# Patient Record
Sex: Female | Born: 1995 | Race: White | Hispanic: No | Marital: Married | Smoking: Never smoker
Health system: Southern US, Community
[De-identification: ages and names within clinical notes are randomized; demographics above are authoritative.]

---
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
## 2017-06-13 DIAGNOSIS — Z0100 Encounter for examination of eyes and vision without abnormal findings: Secondary | ICD-10-CM | POA: Diagnosis not present

## 2019-06-02 ENCOUNTER — Other Ambulatory Visit: Payer: Self-pay | Admitting: Internal Medicine

## 2019-06-02 DIAGNOSIS — E010 Iodine-deficiency related diffuse (endemic) goiter: Secondary | ICD-10-CM

## 2019-06-12 ENCOUNTER — Ambulatory Visit
Admission: RE | Admit: 2019-06-12 | Discharge: 2019-06-12 | Disposition: A | Discharge status: Home / Self Care | Payer: 59 | Source: Ambulatory Visit | Attending: Internal Medicine | Admitting: Internal Medicine

## 2019-06-12 DIAGNOSIS — E010 Iodine-deficiency related diffuse (endemic) goiter: Secondary | ICD-10-CM

## 2019-06-23 ENCOUNTER — Other Ambulatory Visit: Payer: Self-pay | Admitting: Obstetrics and Gynecology

## 2019-06-23 DIAGNOSIS — N6320 Unspecified lump in the left breast, unspecified quadrant: Secondary | ICD-10-CM

## 2019-07-06 ENCOUNTER — Other Ambulatory Visit: Payer: Self-pay

## 2019-07-06 ENCOUNTER — Ambulatory Visit
Admission: RE | Admit: 2019-07-06 | Discharge: 2019-07-06 | Disposition: A | Discharge status: Home / Self Care | Payer: 59 | Source: Ambulatory Visit | Attending: Obstetrics and Gynecology | Admitting: Obstetrics and Gynecology

## 2019-07-06 DIAGNOSIS — N6320 Unspecified lump in the left breast, unspecified quadrant: Secondary | ICD-10-CM

## 2020-06-15 IMAGING — US US THYROID
1 series · 13 of 25 positions shown · non-contrast
Comparison: None.

CLINICAL DATA: Palpable abnormality. 23-year-old female with
thyromegaly

EXAM:
THYROID ULTRASOUND
TECHNIQUE: Ultrasound examination of the thyroid gland and adjacent soft
tissues was performed.

[Series 1: us thyroid · 0.04mm/px · 13 of 38 slices shown]
[im 1/38]
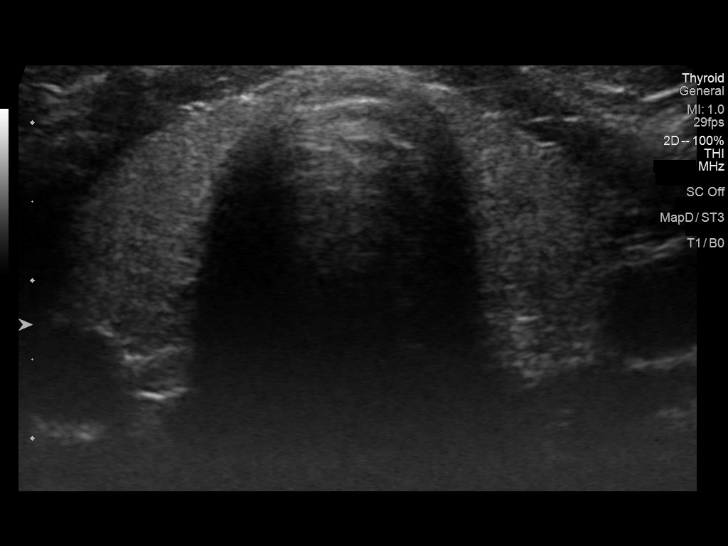
[im 4/38]
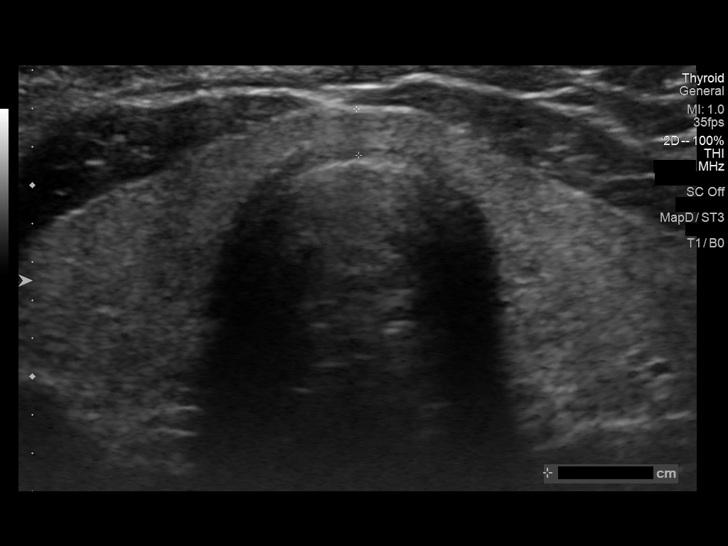
[im 7/38]
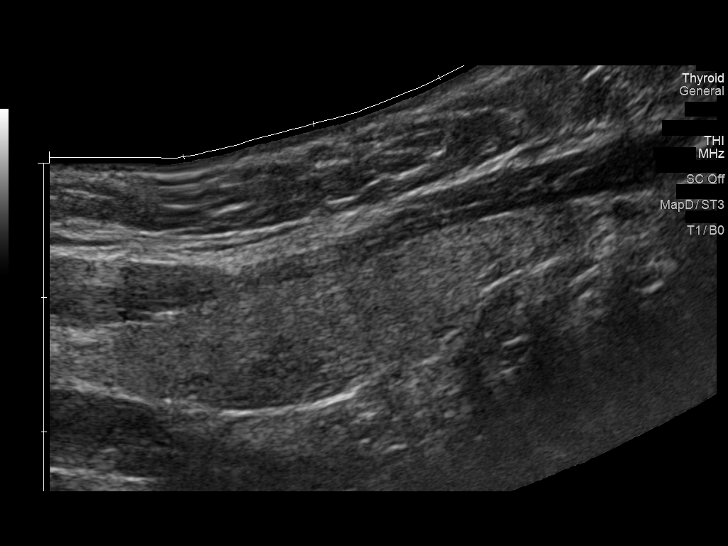
[im 10/38]
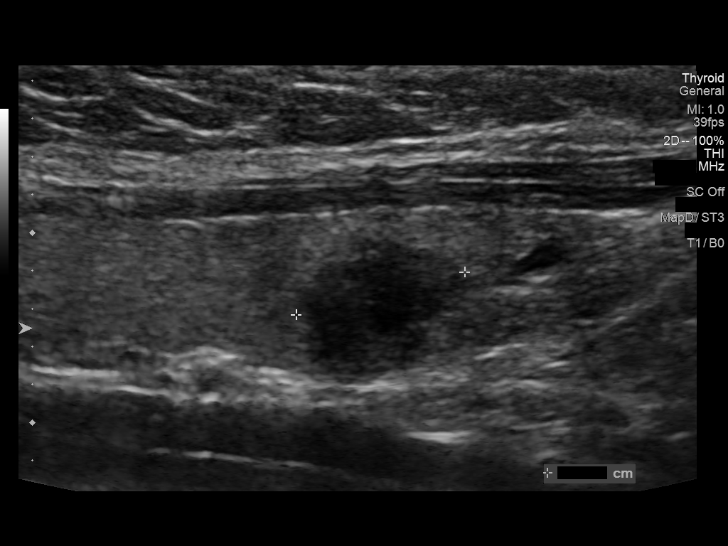
[im 13/38]
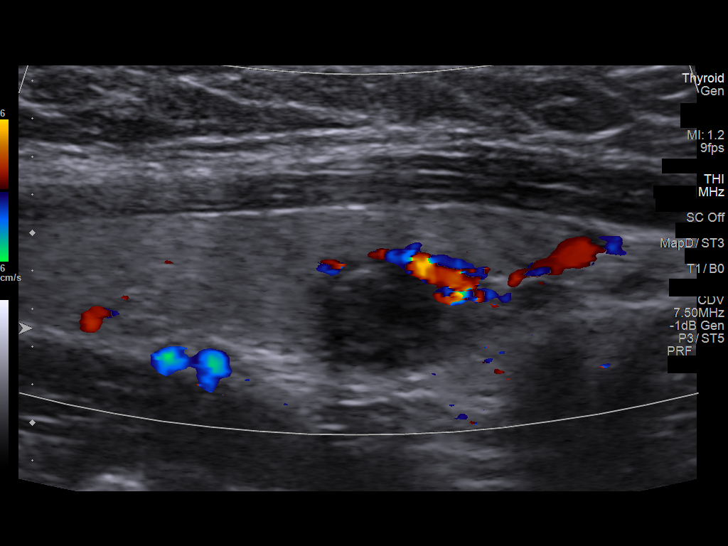
[im 16/38]
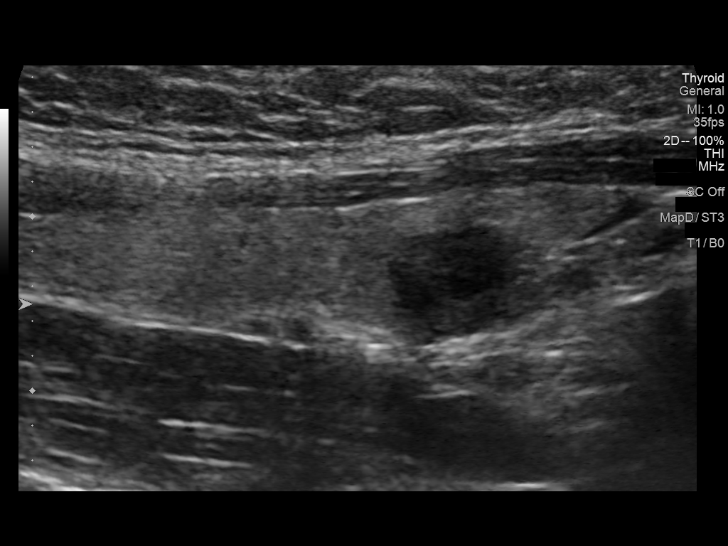
[im 19/38]
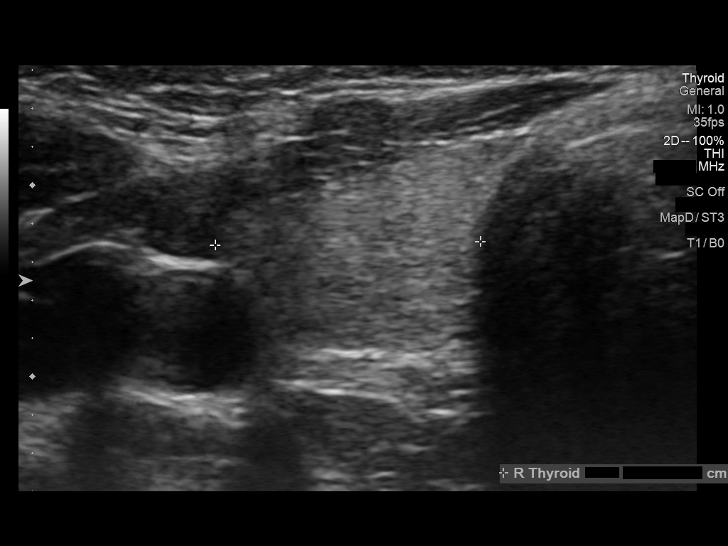
[im 22/38]
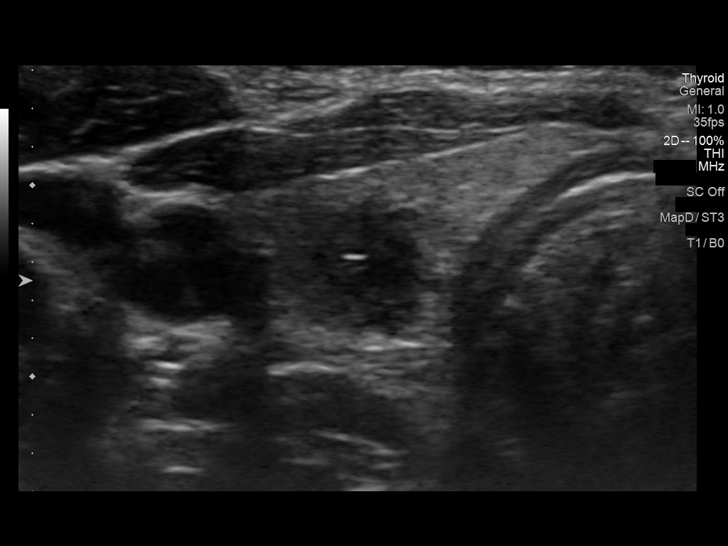
[im 25/38]
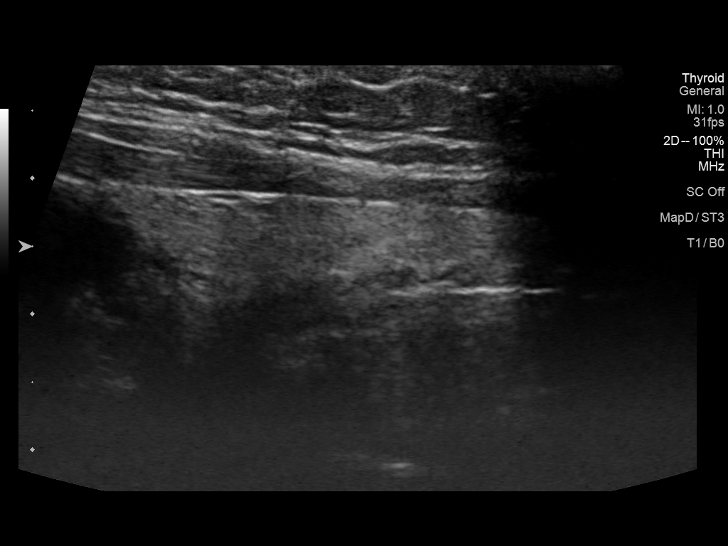
[im 28/38]
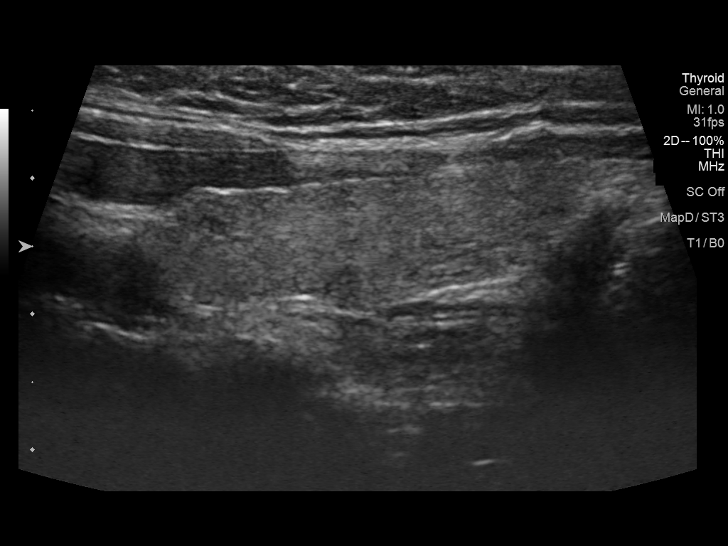
[im 31/38]
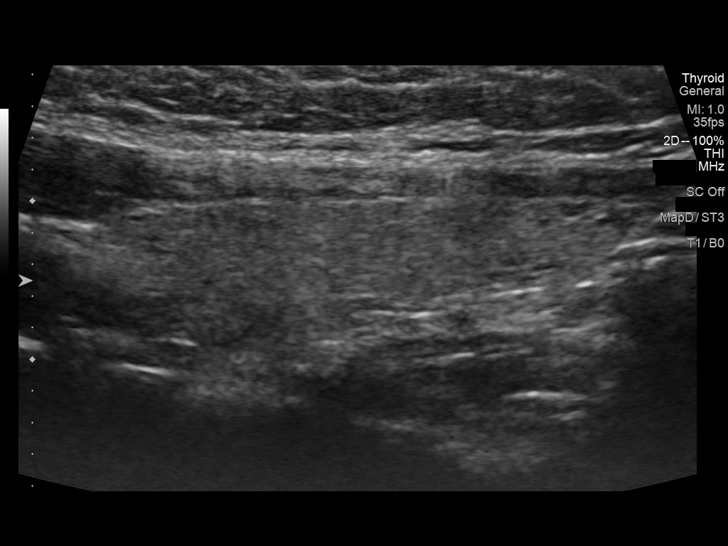
[im 34/38]
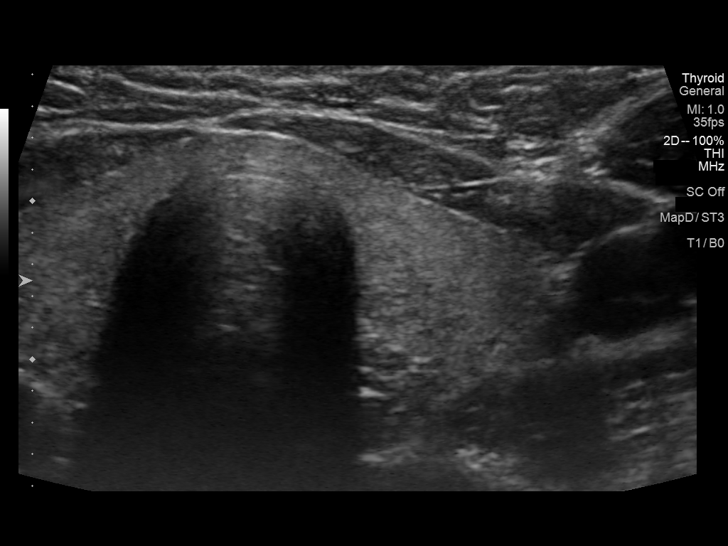
[im 38/38]
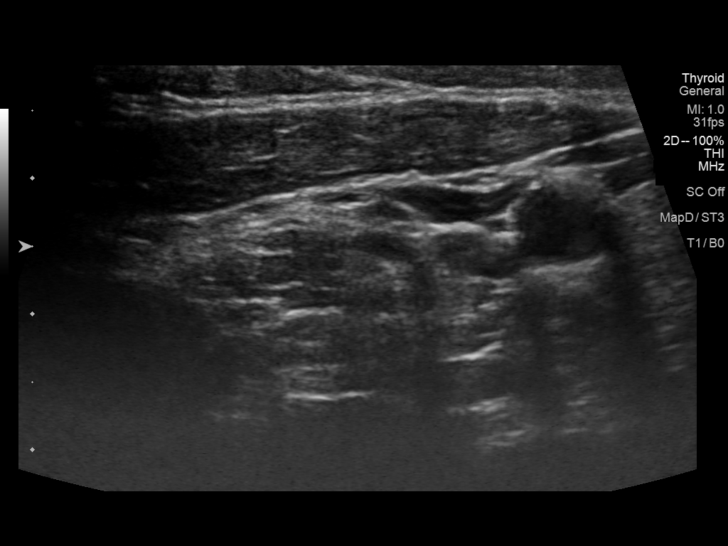

[13 of 25 positions shown; findings below may reference images not displayed]

FINDINGS: Parenchymal Echotexture: Normal

Isthmus: 0.2 cm

Right lobe: 4.1 x 0.9 x 1.4 cm

Left lobe: 4.4 x 1.0 x 1.3 cm

_________________________________________________________

Estimated total number of nodules >/= 1 cm: 0

Number of spongiform nodules >/=  2 cm not described below (TR1): 0

Number of mixed cystic and solid nodules >/= 1.5 cm not described
below (TR2): 0

_________________________________________________________

Nodule # 1:

Location: Right; Inferior

Maximum size: 0.9 cm; Other 2 dimensions: 0.7 x 0.6 cm

Composition: solid/almost completely solid (2)

Echogenicity: very hypoechoic (3)

Shape: not taller-than-wide (0)

Margins: lobulated/irregular (2)

Echogenic foci: none (0)

ACR TI-RADS total points: 7.

ACR TI-RADS risk category: TR5 (>/= 7 points).

ACR TI-RADS recommendations:

*Given size (>/= 0.5 - 0.9 cm) and appearance, a follow-up
ultrasound in 1 year should be considered based on TI-RADS criteria.

_________________________________________________________
IMPRESSION: 1. Incidentally detected 0.9 cm TI-RADS category 5 nodule in the
right inferior gland meets criteria for follow-up ultrasound in 1
year.
2. Otherwise, the thyroid gland is sonographically unremarkable.

The above is in keeping with the ACR TI-RADS recommendations - [HOSPITAL] 2647;[DATE].

## 2020-07-09 IMAGING — US US BREAST*L* LIMITED INC AXILLA
1 series · 2 of 2 positions shown · non-contrast
Comparison: None.

CLINICAL DATA: Patient's physician palpated an abnormality in the 1
o'clock region of the left breast.

EXAM:
ULTRASOUND OF THE LEFT BREAST

[Series 1: us breast*left* limited inc axilla · 0.07mm/px · 2 of 2 slices shown]
[im 1/2]
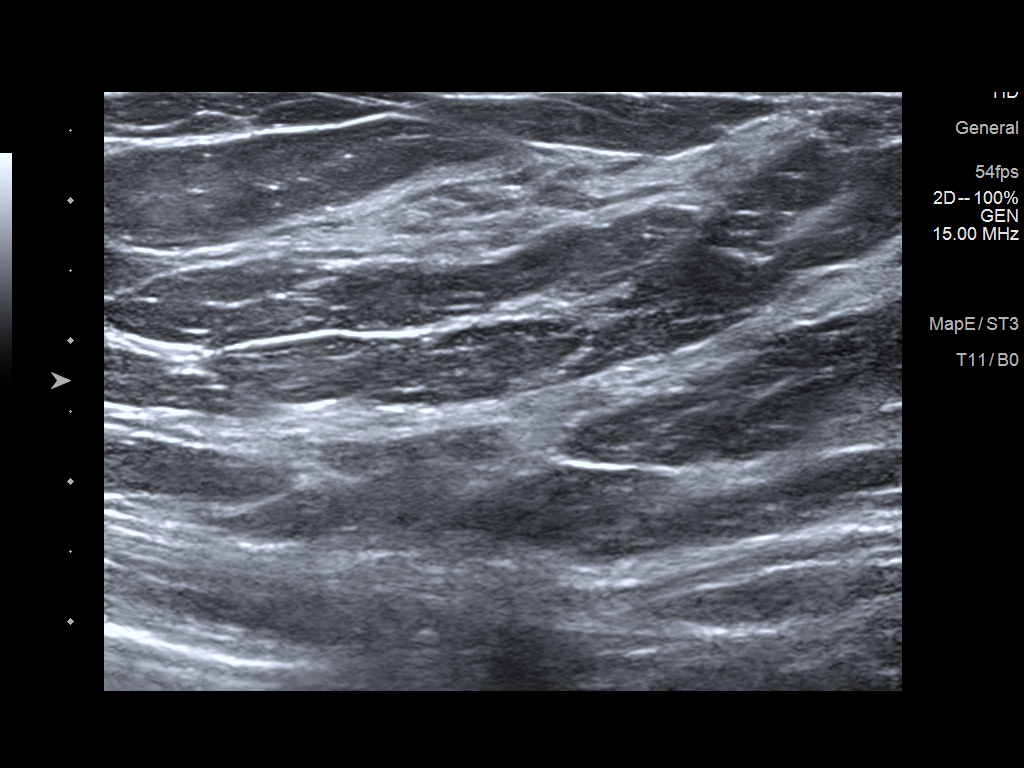
[im 2/2]
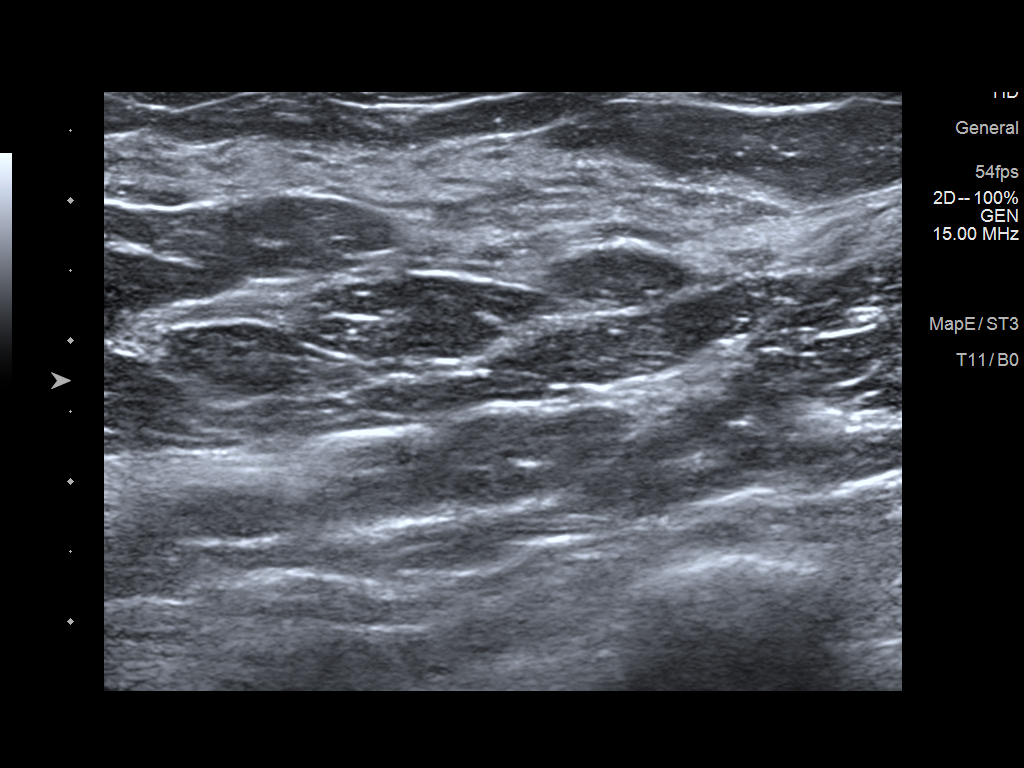

[2 of 2 positions shown; findings below may reference images not displayed]

FINDINGS: On physical exam, I do not palpate a discrete mass in the 1 o'clock
region of the left breast.

Targeted ultrasound is performed, showing normal tissue in the area
of clinical concern in the 1 o'clock region of the left breast. No
solid or cystic mass, abnormal shadowing or distortion visualized.
IMPRESSION: No sonographic evidence of malignancy in the 1 o'clock region of the
left breast.

RECOMMENDATION:
If the clinical exam remains benign/stable screening mammography can
be deferred until the age of 40.

I have discussed the findings and recommendations with the patient.
Results were also provided in writing at the conclusion of the
visit. If applicable, a reminder letter will be sent to the patient
regarding the next appointment.

BI-RADS CATEGORY  1: Negative.

## 2021-04-04 DIAGNOSIS — J0100 Acute maxillary sinusitis, unspecified: Secondary | ICD-10-CM | POA: Diagnosis not present

## 2021-04-05 DIAGNOSIS — Z20822 Contact with and (suspected) exposure to covid-19: Secondary | ICD-10-CM | POA: Diagnosis not present

## 2021-05-24 ENCOUNTER — Other Ambulatory Visit: Payer: Self-pay | Admitting: Internal Medicine

## 2021-05-24 DIAGNOSIS — E041 Nontoxic single thyroid nodule: Secondary | ICD-10-CM

## 2021-06-07 ENCOUNTER — Other Ambulatory Visit: Payer: Self-pay

## 2021-06-07 ENCOUNTER — Ambulatory Visit
Admission: RE | Admit: 2021-06-07 | Discharge: 2021-06-07 | Disposition: A | Discharge status: Home / Self Care | Payer: BC Managed Care – PPO | Source: Ambulatory Visit | Attending: Internal Medicine | Admitting: Internal Medicine

## 2021-06-07 DIAGNOSIS — E041 Nontoxic single thyroid nodule: Secondary | ICD-10-CM

## 2021-06-08 DIAGNOSIS — Z6831 Body mass index (BMI) 31.0-31.9, adult: Secondary | ICD-10-CM | POA: Diagnosis not present

## 2021-06-08 DIAGNOSIS — Z01419 Encounter for gynecological examination (general) (routine) without abnormal findings: Secondary | ICD-10-CM | POA: Diagnosis not present

## 2021-06-27 DIAGNOSIS — Z6830 Body mass index (BMI) 30.0-30.9, adult: Secondary | ICD-10-CM | POA: Diagnosis not present

## 2021-06-27 DIAGNOSIS — E010 Iodine-deficiency related diffuse (endemic) goiter: Secondary | ICD-10-CM | POA: Diagnosis not present

## 2021-06-27 DIAGNOSIS — E039 Hypothyroidism, unspecified: Secondary | ICD-10-CM | POA: Diagnosis not present

## 2021-06-27 DIAGNOSIS — E041 Nontoxic single thyroid nodule: Secondary | ICD-10-CM | POA: Diagnosis not present

## 2022-04-23 ENCOUNTER — Other Ambulatory Visit: Payer: Self-pay | Admitting: Internal Medicine

## 2022-04-23 DIAGNOSIS — E041 Nontoxic single thyroid nodule: Secondary | ICD-10-CM

## 2022-04-23 DIAGNOSIS — E010 Iodine-deficiency related diffuse (endemic) goiter: Secondary | ICD-10-CM

## 2022-04-27 ENCOUNTER — Ambulatory Visit
Admission: RE | Admit: 2022-04-27 | Discharge: 2022-04-27 | Disposition: A | Discharge status: Home / Self Care | Payer: BC Managed Care – PPO | Source: Ambulatory Visit | Attending: Internal Medicine | Admitting: Internal Medicine

## 2022-04-27 DIAGNOSIS — E041 Nontoxic single thyroid nodule: Secondary | ICD-10-CM

## 2022-04-27 DIAGNOSIS — E010 Iodine-deficiency related diffuse (endemic) goiter: Secondary | ICD-10-CM | POA: Diagnosis not present

## 2022-06-28 DIAGNOSIS — E039 Hypothyroidism, unspecified: Secondary | ICD-10-CM | POA: Diagnosis not present

## 2022-09-26 DIAGNOSIS — N911 Secondary amenorrhea: Secondary | ICD-10-CM | POA: Diagnosis not present

## 2022-10-01 DIAGNOSIS — Z3A08 8 weeks gestation of pregnancy: Secondary | ICD-10-CM | POA: Diagnosis not present

## 2022-10-01 DIAGNOSIS — Z3685 Encounter for antenatal screening for Streptococcus B: Secondary | ICD-10-CM | POA: Diagnosis not present

## 2022-10-01 DIAGNOSIS — Z3481 Encounter for supervision of other normal pregnancy, first trimester: Secondary | ICD-10-CM | POA: Diagnosis not present

## 2022-10-01 LAB — OB RESULTS CONSOLE ABO/RH: RH Type: POSITIVE

## 2022-10-01 LAB — OB RESULTS CONSOLE ANTIBODY SCREEN: Antibody Screen: NEGATIVE

## 2022-10-01 LAB — OB RESULTS CONSOLE RUBELLA ANTIBODY, IGM: Rubella: IMMUNE

## 2022-10-01 LAB — OB RESULTS CONSOLE RPR: RPR: NONREACTIVE

## 2022-10-01 LAB — OB RESULTS CONSOLE HIV ANTIBODY (ROUTINE TESTING): HIV: NONREACTIVE

## 2022-10-01 LAB — HEPATITIS C ANTIBODY: HCV Ab: NEGATIVE

## 2022-10-01 LAB — OB RESULTS CONSOLE HEPATITIS B SURFACE ANTIGEN: Hepatitis B Surface Ag: NEGATIVE

## 2022-10-17 DIAGNOSIS — Z3481 Encounter for supervision of other normal pregnancy, first trimester: Secondary | ICD-10-CM | POA: Diagnosis not present

## 2022-10-17 DIAGNOSIS — Z3401 Encounter for supervision of normal first pregnancy, first trimester: Secondary | ICD-10-CM | POA: Diagnosis not present

## 2022-10-17 DIAGNOSIS — Z113 Encounter for screening for infections with a predominantly sexual mode of transmission: Secondary | ICD-10-CM | POA: Diagnosis not present

## 2022-10-17 DIAGNOSIS — Z124 Encounter for screening for malignant neoplasm of cervix: Secondary | ICD-10-CM | POA: Diagnosis not present

## 2022-10-17 DIAGNOSIS — Z3685 Encounter for antenatal screening for Streptococcus B: Secondary | ICD-10-CM | POA: Diagnosis not present

## 2022-10-17 DIAGNOSIS — Z1151 Encounter for screening for human papillomavirus (HPV): Secondary | ICD-10-CM | POA: Diagnosis not present

## 2022-10-17 DIAGNOSIS — Z3A10 10 weeks gestation of pregnancy: Secondary | ICD-10-CM | POA: Diagnosis not present

## 2022-10-17 LAB — OB RESULTS CONSOLE GC/CHLAMYDIA
Chlamydia: NEGATIVE
Neisseria Gonorrhea: NEGATIVE

## 2022-11-16 DIAGNOSIS — Z3A14 14 weeks gestation of pregnancy: Secondary | ICD-10-CM | POA: Diagnosis not present

## 2022-11-16 DIAGNOSIS — Z3400 Encounter for supervision of normal first pregnancy, unspecified trimester: Secondary | ICD-10-CM | POA: Diagnosis not present

## 2022-11-16 DIAGNOSIS — E039 Hypothyroidism, unspecified: Secondary | ICD-10-CM | POA: Diagnosis not present

## 2022-12-31 DIAGNOSIS — Z363 Encounter for antenatal screening for malformations: Secondary | ICD-10-CM | POA: Diagnosis not present

## 2022-12-31 DIAGNOSIS — Z3A21 21 weeks gestation of pregnancy: Secondary | ICD-10-CM | POA: Diagnosis not present

## 2022-12-31 DIAGNOSIS — Z361 Encounter for antenatal screening for raised alphafetoprotein level: Secondary | ICD-10-CM | POA: Diagnosis not present

## 2023-01-31 DIAGNOSIS — Z362 Encounter for other antenatal screening follow-up: Secondary | ICD-10-CM | POA: Diagnosis not present

## 2023-01-31 DIAGNOSIS — Z3A25 25 weeks gestation of pregnancy: Secondary | ICD-10-CM | POA: Diagnosis not present

## 2023-02-12 DIAGNOSIS — Z3402 Encounter for supervision of normal first pregnancy, second trimester: Secondary | ICD-10-CM | POA: Diagnosis not present

## 2023-02-14 DIAGNOSIS — O99810 Abnormal glucose complicating pregnancy: Secondary | ICD-10-CM | POA: Diagnosis not present

## 2023-03-18 DIAGNOSIS — M5489 Other dorsalgia: Secondary | ICD-10-CM | POA: Diagnosis not present

## 2023-03-20 DIAGNOSIS — Z713 Dietary counseling and surveillance: Secondary | ICD-10-CM | POA: Diagnosis not present

## 2023-03-21 DIAGNOSIS — Z713 Dietary counseling and surveillance: Secondary | ICD-10-CM | POA: Diagnosis not present

## 2023-04-16 DIAGNOSIS — Z3685 Encounter for antenatal screening for Streptococcus B: Secondary | ICD-10-CM | POA: Diagnosis not present

## 2023-04-16 LAB — OB RESULTS CONSOLE GBS: GBS: NEGATIVE

## 2023-05-01 IMAGING — US US THYROID
1 series · 13 of 25 positions shown · non-contrast
Comparison: 5755, 3131

CLINICAL DATA: Thyroid nodule, cold.  Thyromegaly

EXAM:
THYROID ULTRASOUND
TECHNIQUE: Ultrasound examination of the thyroid gland and adjacent soft
tissues was performed.

[Series 1: us thyroid · 0.04mm/px · 67 acquisitions, 13 frames shown]
[im 1/67]
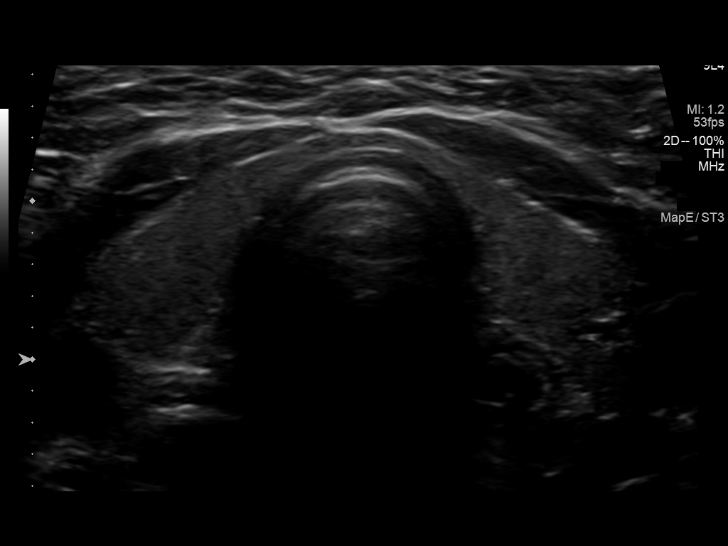
[im 6/67]
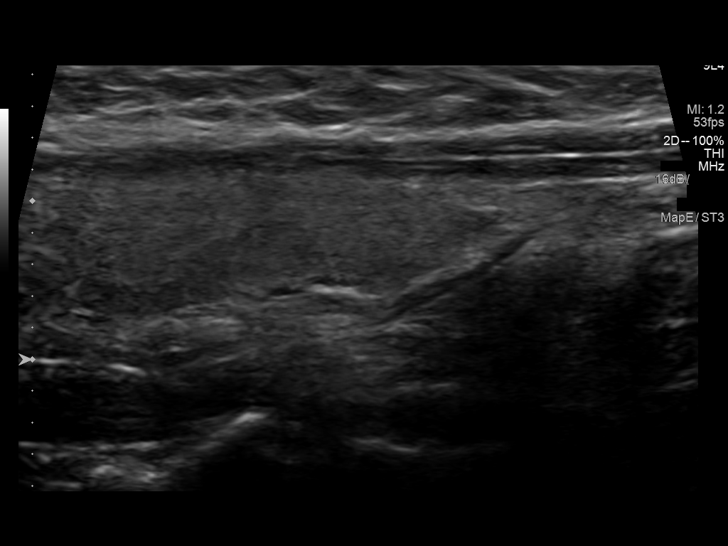
[im 12/67]
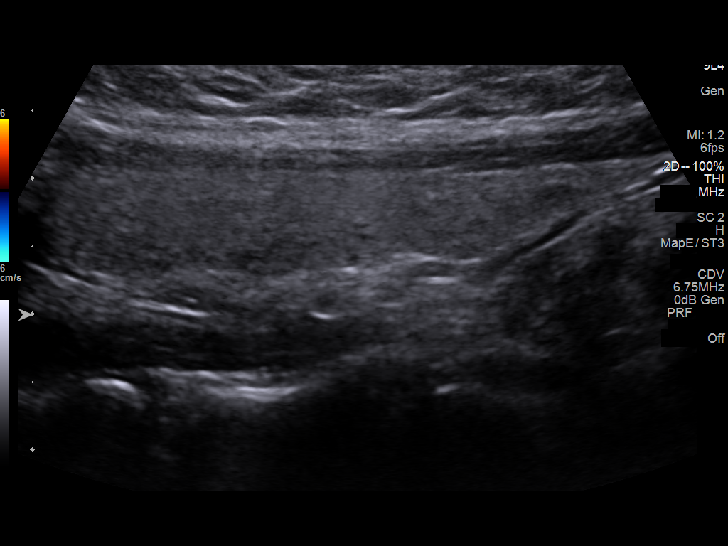
[im 17/67]
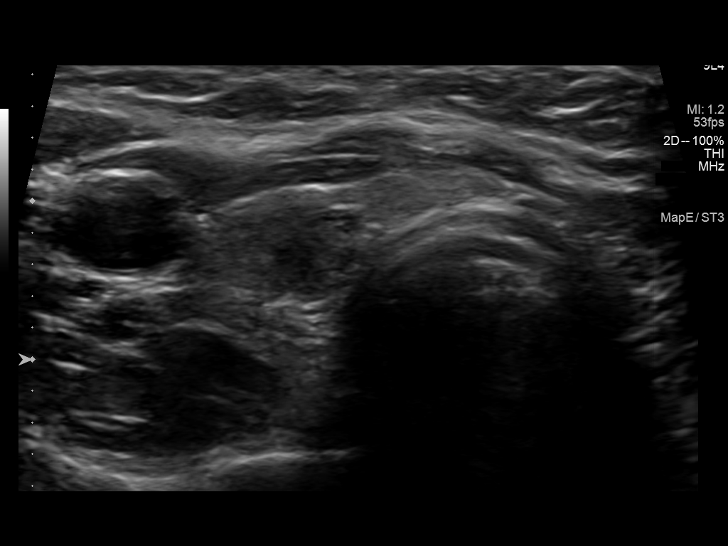
[im 23/67]
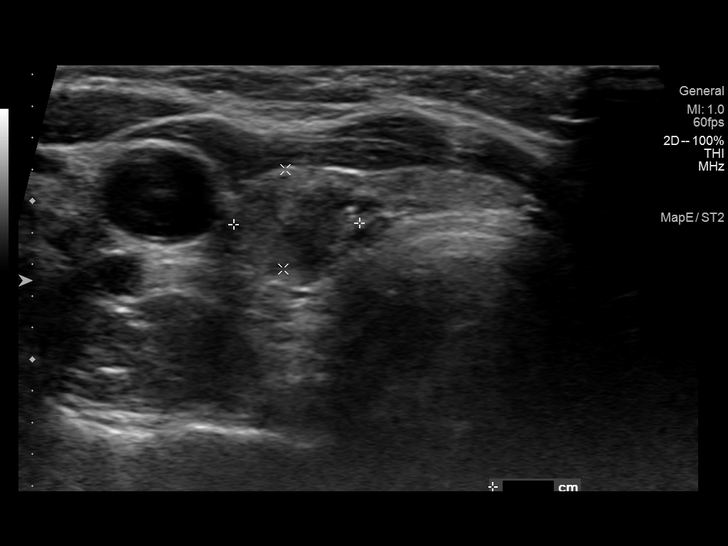
[im 28/67]
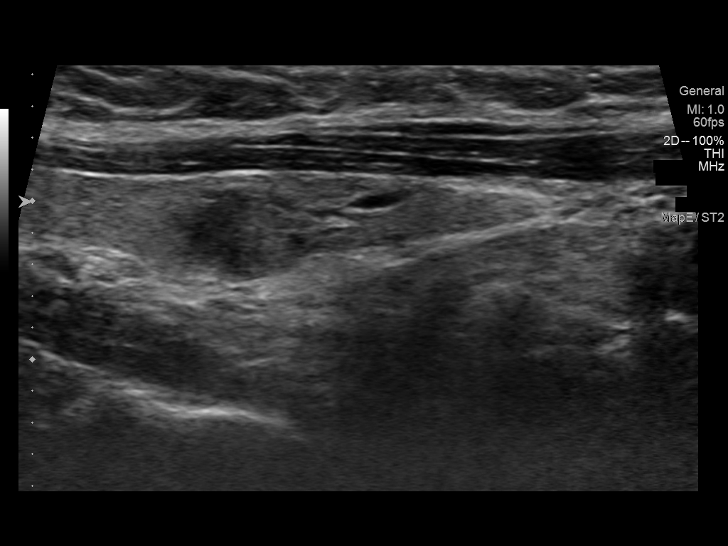
[im 34/67]
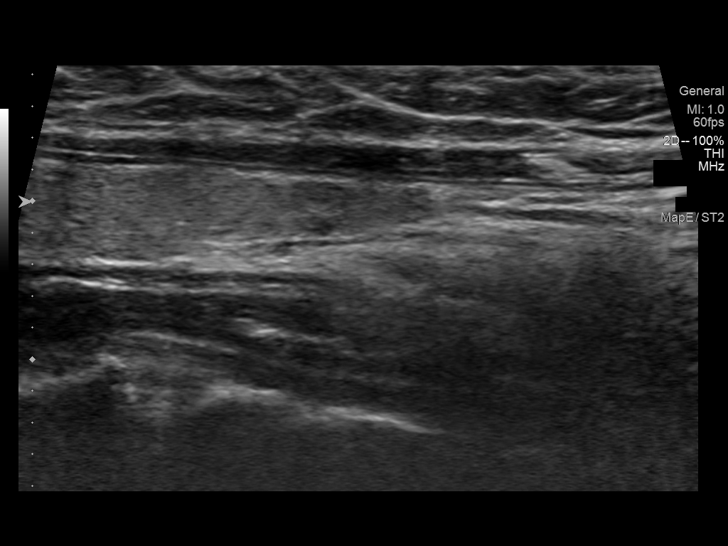
[im 39/67]
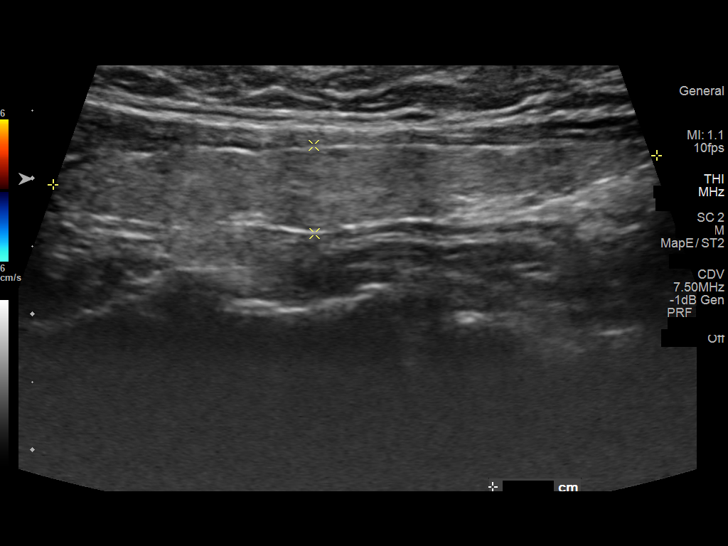
[im 45/67]
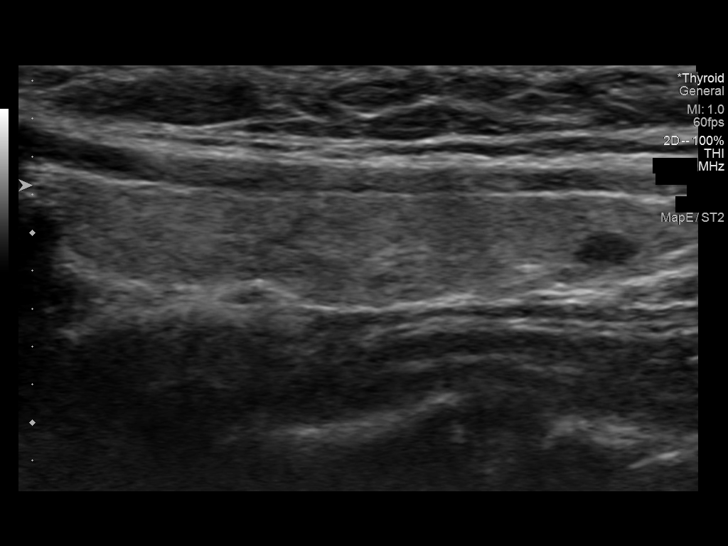
[im 50/67]
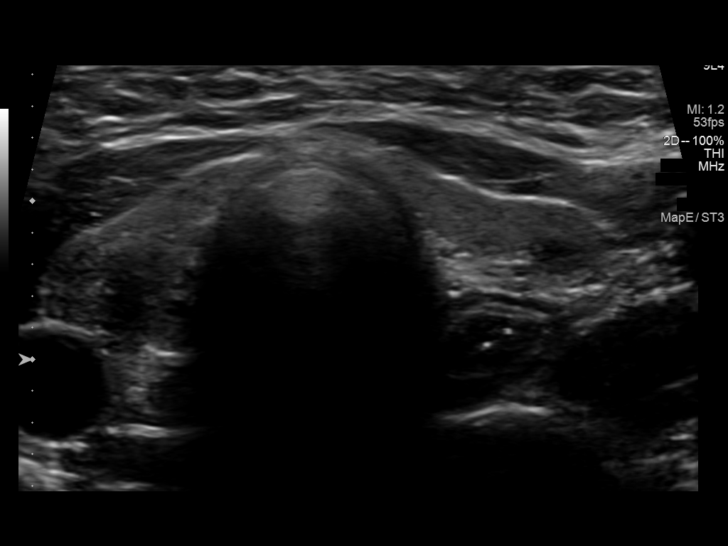
[im 56/67]
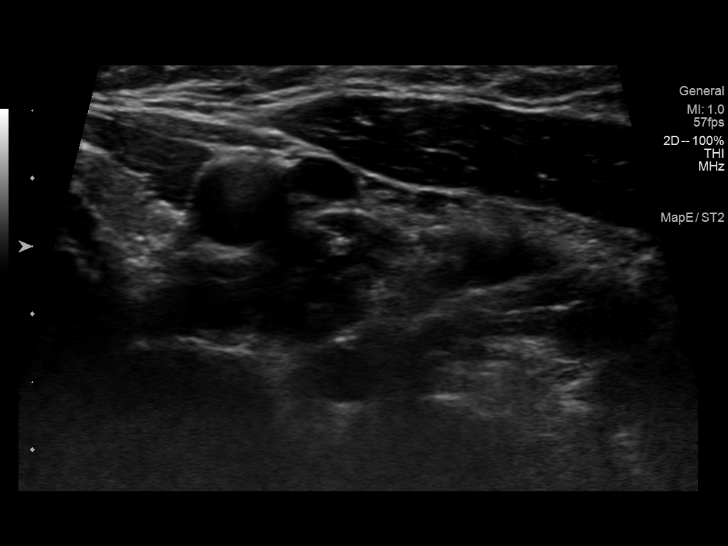
[im 61/67]
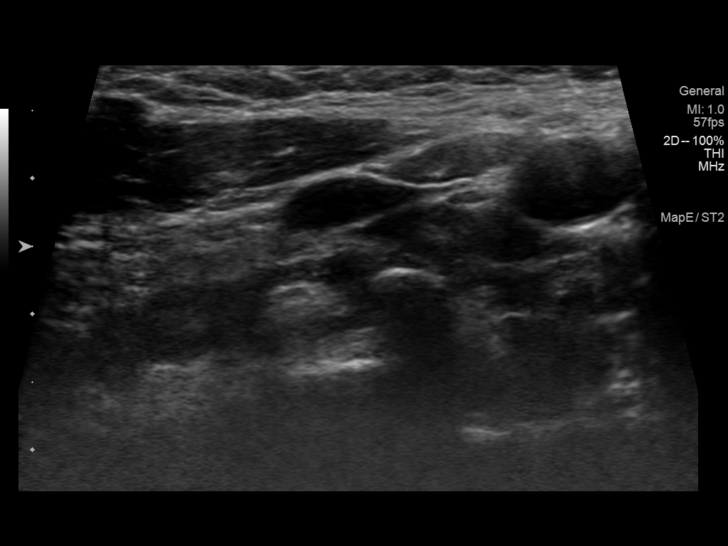
[im 67/67]
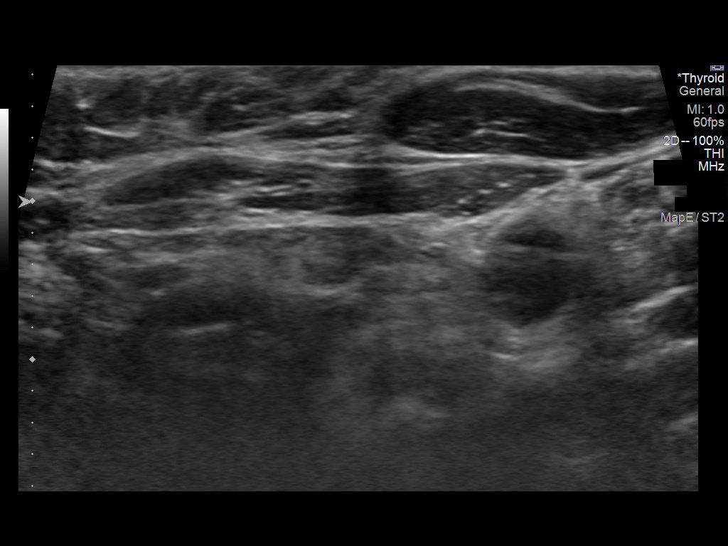

[13 of 25 positions shown; findings below may reference images not displayed]

FINDINGS: Parenchymal Echotexture: Mildly heterogenous

Isthmus: 0.1 cm

Right lobe: 4.4 x 0.7 x 1.2 cm

Left lobe: 4.5 x 0.7 x 1.2 cm

_________________________________________________________

Estimated total number of nodules >/= 1 cm: 0

Number of spongiform nodules >/=  2 cm not described below (TR1): 0

Number of mixed cystic and solid nodules >/= 1.5 cm not described
below (TR2): 0

_________________________________________________________

Nodule labeled 1 refers to a previously described TR 5 nodule in the
inferior right thyroid lobe measures 0.9 x 0.9 x 0.6 cm on today's
exam, previously measuring up to 0.9 cm. Of note, it appears less
conspicuous and well-defined on today's exam. It remains similar in
size. *Given size (>/= 0.5 - 0.9 cm) and appearance, a follow-up
ultrasound in 1 year should be considered based on TI-RADS criteria.
IMPRESSION: Previously described TR 5 nodule labeled 1 in the inferior right
thyroid lobe is now less conspicuous, although remains similar in
size measuring 0.9 cm on today's exam. This nodule continues to meet
criteria for follow-up ultrasound in 1 year. This exam demonstrates
3 year stability. A total follow-up interval of 5 years is
recommended.

No new suspicious nodules on today's exam.

The above is in keeping with the ACR TI-RADS recommendations - [HOSPITAL] 1842;[DATE].

## 2023-05-08 ENCOUNTER — Telehealth (HOSPITAL_COMMUNITY): Payer: Self-pay | Admitting: *Deleted

## 2023-05-08 ENCOUNTER — Encounter (HOSPITAL_COMMUNITY): Payer: Self-pay | Admitting: *Deleted

## 2023-05-08 NOTE — H&P (Signed)
Kathleen Zamora is a 27 y.o. female presenting for IOL. Pregnancy complicated by hypothyroidism. OB History     Gravida  1   Para      Term      Preterm      AB      Living         SAB      IAB      Ectopic      Multiple      Live Births             No past medical history on file.  Family History: family history is not on file. Social History:  has no history on file for tobacco use, alcohol use, and drug use.     Maternal Diabetes: No Genetic Screening: Normal Maternal Ultrasounds/Referrals: Normal Fetal Ultrasounds or other Referrals:  None Maternal Substance Abuse:  No Significant Maternal Medications:  Meds include: Syntroid Significant Maternal Lab Results:  Group B Strep negative Number of Prenatal Visits:greater than 3 verified prenatal visits Other Comments:  None  Review of Systems  Constitutional:  Negative for fever.  Neurological:  Negative for headaches.   Maternal Medical History:  Fetal activity: Perceived fetal activity is normal.       There were no vitals taken for this visit. Maternal Exam:  Abdomen: Fetal presentation: vertex   Physical Exam Cardiovascular:     Rate and Rhythm: Normal rate.  Pulmonary:     Effort: Pulmonary effort is normal.    Cx 1/90/-2 in office  Prenatal labs: ABO, Rh: A/Positive/-- (10/16 0000) Antibody: Negative (10/16 0000) Rubella: Immune (10/16 0000) RPR: Nonreactive (10/16 0000)  HBsAg: Negative (10/16 0000)  HIV: Non-reactive (10/16 0000)  GBS: Negative/-- (04/30 0000)   Assessment/Plan: 27 yo G1P0 @ 39 3/7 weeks for IOL   Kathleen Zamora II 05/08/2023, 5:08 PM

## 2023-05-08 NOTE — Telephone Encounter (Signed)
Preadmission screen  

## 2023-05-09 ENCOUNTER — Inpatient Hospital Stay (HOSPITAL_COMMUNITY)
Admission: AD | Admit: 2023-05-09 | Discharge: 2023-05-12 | DRG: 807 | Disposition: A | Discharge status: Home / Self Care | Payer: BC Managed Care – PPO | Attending: Obstetrics and Gynecology | Admitting: Obstetrics and Gynecology

## 2023-05-09 ENCOUNTER — Inpatient Hospital Stay (HOSPITAL_COMMUNITY): Payer: BC Managed Care – PPO | Admitting: Anesthesiology

## 2023-05-09 ENCOUNTER — Other Ambulatory Visit: Payer: Self-pay

## 2023-05-09 DIAGNOSIS — E039 Hypothyroidism, unspecified: Secondary | ICD-10-CM | POA: Diagnosis not present

## 2023-05-09 DIAGNOSIS — Z23 Encounter for immunization: Secondary | ICD-10-CM | POA: Diagnosis not present

## 2023-05-09 DIAGNOSIS — Z3A39 39 weeks gestation of pregnancy: Secondary | ICD-10-CM | POA: Diagnosis not present

## 2023-05-09 DIAGNOSIS — J939 Pneumothorax, unspecified: Secondary | ICD-10-CM | POA: Diagnosis not present

## 2023-05-09 DIAGNOSIS — O99284 Endocrine, nutritional and metabolic diseases complicating childbirth: Secondary | ICD-10-CM | POA: Diagnosis not present

## 2023-05-09 DIAGNOSIS — R0603 Acute respiratory distress: Secondary | ICD-10-CM | POA: Diagnosis not present

## 2023-05-09 DIAGNOSIS — Z3490 Encounter for supervision of normal pregnancy, unspecified, unspecified trimester: Principal | ICD-10-CM

## 2023-05-09 DIAGNOSIS — O26893 Other specified pregnancy related conditions, third trimester: Secondary | ICD-10-CM | POA: Diagnosis not present

## 2023-05-09 LAB — CBC
HCT: 36.6 % (ref 36.0–46.0)
Hemoglobin: 12.7 g/dL (ref 12.0–15.0)
MCH: 31.1 pg (ref 26.0–34.0)
MCHC: 34.7 g/dL (ref 30.0–36.0)
MCV: 89.5 fL (ref 80.0–100.0)
Platelets: 262 10*3/uL (ref 150–400)
RBC: 4.09 MIL/uL (ref 3.87–5.11)
RDW: 12.7 % (ref 11.5–15.5)
WBC: 12.6 10*3/uL — ABNORMAL HIGH (ref 4.0–10.5)
nRBC: 0 % (ref 0.0–0.2)

## 2023-05-09 LAB — TYPE AND SCREEN
ABO/RH(D): A POS
Antibody Screen: NEGATIVE

## 2023-05-09 LAB — HIV ANTIBODY (ROUTINE TESTING W REFLEX): HIV Screen 4th Generation wRfx: NONREACTIVE

## 2023-05-09 MED ORDER — DIPHENHYDRAMINE HCL 50 MG/ML IJ SOLN: 12.5000 mg | INTRAMUSCULAR | Status: DC | PRN

## 2023-05-09 MED ORDER — LACTATED RINGERS IV SOLN: INTRAVENOUS | Status: DC

## 2023-05-09 MED ORDER — FLEET ENEMA 7-19 GM/118ML RE ENEM: 1.0000 | ENEMA | RECTAL | Status: DC | PRN

## 2023-05-09 MED ORDER — SOD CITRATE-CITRIC ACID 500-334 MG/5ML PO SOLN: 30.0000 mL | ORAL | Status: DC | PRN

## 2023-05-09 MED ORDER — LACTATED RINGERS IV SOLN: 500.0000 mL | INTRAVENOUS | Status: DC | PRN

## 2023-05-09 MED ORDER — TERBUTALINE SULFATE 1 MG/ML IJ SOLN: 0.2500 mg | Freq: Once | INTRAMUSCULAR | Status: DC | PRN

## 2023-05-09 MED ORDER — OXYCODONE-ACETAMINOPHEN 5-325 MG PO TABS: 1.0000 | ORAL_TABLET | ORAL | Status: DC | PRN

## 2023-05-09 MED ORDER — OXYTOCIN BOLUS FROM INFUSION: 333.0000 mL | Freq: Once | INTRAVENOUS | Status: DC

## 2023-05-09 MED ORDER — PHENYLEPHRINE 80 MCG/ML (10ML) SYRINGE FOR IV PUSH (FOR BLOOD PRESSURE SUPPORT): 80.0000 ug | PREFILLED_SYRINGE | INTRAVENOUS | Status: DC | PRN

## 2023-05-09 MED ORDER — EPHEDRINE 5 MG/ML INJ: 10.0000 mg | INTRAVENOUS | Status: DC | PRN

## 2023-05-09 MED ORDER — PHENYLEPHRINE 80 MCG/ML (10ML) SYRINGE FOR IV PUSH (FOR BLOOD PRESSURE SUPPORT)
80.0000 ug | PREFILLED_SYRINGE | INTRAVENOUS | Status: DC | PRN
  Filled 2023-05-09: qty 10

## 2023-05-09 MED ORDER — FENTANYL-BUPIVACAINE-NACL 0.5-0.125-0.9 MG/250ML-% EP SOLN
12.0000 mL/h | EPIDURAL | Status: DC | PRN
  Filled 2023-05-09: qty 250

## 2023-05-09 MED ORDER — LACTATED RINGERS IV SOLN: 500.0000 mL | Freq: Once | INTRAVENOUS | Status: DC

## 2023-05-09 MED ORDER — OXYTOCIN-SODIUM CHLORIDE 30-0.9 UT/500ML-% IV SOLN
1.0000 m[IU]/min | INTRAVENOUS | Status: DC
  Administered 2023-05-09: 2 m[IU]/min via INTRAVENOUS
  Filled 2023-05-09: qty 1000

## 2023-05-09 MED ORDER — ONDANSETRON HCL 4 MG/2ML IJ SOLN
4.0000 mg | Freq: Four times a day (QID) | INTRAMUSCULAR | Status: DC | PRN
  Administered 2023-05-10: 4 mg via INTRAVENOUS
  Filled 2023-05-09: qty 2

## 2023-05-09 MED ORDER — OXYCODONE-ACETAMINOPHEN 5-325 MG PO TABS: 2.0000 | ORAL_TABLET | ORAL | Status: DC | PRN

## 2023-05-09 MED ORDER — LIDOCAINE HCL (PF) 1 % IJ SOLN
30.0000 mL | INTRAMUSCULAR | Status: AC | PRN
  Administered 2023-05-10: 30 mL via SUBCUTANEOUS

## 2023-05-09 MED ORDER — ACETAMINOPHEN 325 MG PO TABS: 650.0000 mg | ORAL_TABLET | ORAL | Status: DC | PRN

## 2023-05-09 MED ORDER — OXYTOCIN-SODIUM CHLORIDE 30-0.9 UT/500ML-% IV SOLN
2.5000 [IU]/h | INTRAVENOUS | Status: DC
  Filled 2023-05-09: qty 500

## 2023-05-09 MED ORDER — FENTANYL CITRATE (PF) 100 MCG/2ML IJ SOLN
50.0000 ug | INTRAMUSCULAR | Status: DC | PRN
  Administered 2023-05-09: 50 ug via INTRAVENOUS
  Administered 2023-05-09: 100 ug via INTRAVENOUS
  Filled 2023-05-09 (×2): qty 2

## 2023-05-09 NOTE — Progress Notes (Signed)
Leaking since about 6 am Exam in office>gross ROM with clear fluid Irregular and mild UC  Today's Vitals   05/09/23 1235  Weight: 93.6 kg   There is no height or weight on file to calculate BMI.   FHT cat one UCs irritability  Cx in office 2/90/-2/vtx  A/P: D/W patient above and anticipated length of labor         Pitocin augmentation. She states she understands and agrees

## 2023-05-09 NOTE — Progress Notes (Signed)
FHT cat one UCs q2-4 min Cx 2/C/-3/vtx Pitocin infusing

## 2023-05-09 NOTE — Progress Notes (Signed)
FHT cat one Cx 3/90/-3 UCs q2-4 min Unable to pass IUPC, vtx well applied Pitocin infusing

## 2023-05-10 ENCOUNTER — Encounter (HOSPITAL_COMMUNITY): Payer: Self-pay | Admitting: Obstetrics and Gynecology

## 2023-05-10 LAB — RPR: RPR Ser Ql: NONREACTIVE

## 2023-05-10 MED ORDER — DIBUCAINE (PERIANAL) 1 % EX OINT: 1.0000 | TOPICAL_OINTMENT | CUTANEOUS | Status: DC | PRN

## 2023-05-10 MED ORDER — SIMETHICONE 80 MG PO CHEW: 80.0000 mg | CHEWABLE_TABLET | ORAL | Status: DC | PRN

## 2023-05-10 MED ORDER — ACETAMINOPHEN 325 MG PO TABS
650.0000 mg | ORAL_TABLET | ORAL | Status: DC | PRN
  Administered 2023-05-10 – 2023-05-12 (×4): 650 mg via ORAL
  Filled 2023-05-10 (×4): qty 2

## 2023-05-10 MED ORDER — PRENATAL MULTIVITAMIN CH
1.0000 | ORAL_TABLET | Freq: Every day | ORAL | Status: DC
  Administered 2023-05-10 – 2023-05-12 (×3): 1 via ORAL
  Filled 2023-05-10 (×3): qty 1

## 2023-05-10 MED ORDER — ZOLPIDEM TARTRATE 5 MG PO TABS: 5.0000 mg | ORAL_TABLET | Freq: Every evening | ORAL | Status: DC | PRN

## 2023-05-10 MED ORDER — ONDANSETRON HCL 4 MG PO TABS: 4.0000 mg | ORAL_TABLET | ORAL | Status: DC | PRN

## 2023-05-10 MED ORDER — SENNOSIDES-DOCUSATE SODIUM 8.6-50 MG PO TABS
2.0000 | ORAL_TABLET | ORAL | Status: DC
  Administered 2023-05-10 – 2023-05-12 (×3): 2 via ORAL
  Filled 2023-05-10 (×3): qty 2

## 2023-05-10 MED ORDER — ONDANSETRON HCL 4 MG/2ML IJ SOLN: 4.0000 mg | INTRAMUSCULAR | Status: DC | PRN

## 2023-05-10 MED ORDER — DIPHENHYDRAMINE HCL 25 MG PO CAPS: 25.0000 mg | ORAL_CAPSULE | Freq: Four times a day (QID) | ORAL | Status: DC | PRN

## 2023-05-10 MED ORDER — LIDOCAINE HCL (PF) 1 % IJ SOLN
INTRAMUSCULAR | Status: AC
  Filled 2023-05-10: qty 30

## 2023-05-10 MED ORDER — OXYCODONE HCL 5 MG PO TABS: 5.0000 mg | ORAL_TABLET | ORAL | Status: DC | PRN

## 2023-05-10 MED ORDER — LIDOCAINE HCL (PF) 1 % IJ SOLN
INTRAMUSCULAR | Status: DC | PRN
  Administered 2023-05-09: 10 mL via EPIDURAL
  Administered 2023-05-09: 2 mL via EPIDURAL

## 2023-05-10 MED ORDER — WITCH HAZEL-GLYCERIN EX PADS: 1.0000 | MEDICATED_PAD | CUTANEOUS | Status: DC | PRN

## 2023-05-10 MED ORDER — BENZOCAINE-MENTHOL 20-0.5 % EX AERO
1.0000 | INHALATION_SPRAY | CUTANEOUS | Status: DC | PRN
  Administered 2023-05-10: 1 via TOPICAL
  Filled 2023-05-10: qty 56

## 2023-05-10 MED ORDER — COCONUT OIL OIL: 1.0000 | TOPICAL_OIL | Status: DC | PRN

## 2023-05-10 MED ORDER — OXYCODONE HCL 5 MG PO TABS: 10.0000 mg | ORAL_TABLET | ORAL | Status: DC | PRN

## 2023-05-10 MED ORDER — TETANUS-DIPHTH-ACELL PERTUSSIS 5-2.5-18.5 LF-MCG/0.5 IM SUSY: 0.5000 mL | PREFILLED_SYRINGE | Freq: Once | INTRAMUSCULAR | Status: DC

## 2023-05-10 MED ORDER — FENTANYL-BUPIVACAINE-NACL 0.5-0.125-0.9 MG/250ML-% EP SOLN
EPIDURAL | Status: DC | PRN
  Administered 2023-05-09: 12 mL/h via EPIDURAL

## 2023-05-10 MED ORDER — IBUPROFEN 600 MG PO TABS
600.0000 mg | ORAL_TABLET | Freq: Four times a day (QID) | ORAL | Status: DC
  Administered 2023-05-10 – 2023-05-12 (×10): 600 mg via ORAL
  Filled 2023-05-10 (×10): qty 1

## 2023-05-10 NOTE — Lactation Note (Signed)
This note was copied from a baby's chart.  NICU Lactation Consultation Note  Patient Name: Kathleen Zamora [id number] Date: 05/10/2023 Age:27 hours  Reason for consult: Maternal endocrine disorder; Initial assessment; NICU baby; Term Type of Endocrine Disorder?: Thyroid (hypothyroid)  SUBJECTIVE Visited with family of 10 hours old FT NICU female; Kathleen Zamora is a P1 and reported she's been working on hand expression pre-natally, her insurance covered 5 different breastfeeding classes and she attended all of them, praised her for her efforts. She's already pumping and getting small droplets of colostrum praised her for her efforts. Baby cueing, asked Dr. Alice Zamora who was present during this [facility] consult if baby could go to breast outside of his feeding schedule if awake, alert and cueing; she agreed and this LC took baby "Kathleen Zamora" to the L side in cross cradle hold but he only suck a few times before becoming "uninterested" at the breast. An attempt was documented in flowsheets. Reviewed normal newborn behavior, feeding cues, lactogenesis II, pumping schedule and anticipatory guidelines.  OBJECTIVE Infant data: Mother's Current Feeding Choice: Breast Milk and Donor Milk  Infant feeding assessment Scale for Readiness: 3   Maternal data: G1P1001  Vaginal, Spontaneous Has patient been taught Hand Expression?: Yes Hand Expression Comments: colostrum noted Significant Breast History:: (+) breast changes during the pregnancy, she started seeing colostrum at 18-20 weeks Current breast feeding challenges:: NICU admission Does the patient have breastfeeding experience prior to this delivery?: No Pumping frequency: initited pumping at 6 hours post-partum Pumped volume: 0 mL (drops) Flange Size: 21 Risk factor for low milk supply:: primipara, prematurity, hypothyroid, infant separation  Pump: Personal, Hands Free (Spectra S1 and Mom Cozy)  ASSESSMENT Infant: LATCH Documentation Latch: 1 (just a few  sucks) Audible Swallowing: 0 Type of Nipple: 2 (short shafted) Comfort (Breast/Nipple): 2 Hold (Positioning): 1 LATCH Score: 6  Feeding Status: Scheduled 9-12-3-6  Maternal: Milk volume: Normal  INTERVENTIONS/PLAN Interventions: Interventions: Breast feeding basics reviewed; Breast massage; Hand express; DEBP; Education; [employer] brochure Tools: Pump; Flanges; Medicine Dropper Pump Education: Setup, frequency, and cleaning; Milk Storage  Plan: Encouraged pumping every 3 hours, ideally 8 pumping sessions/24 hours Breast massage and hand expression were also encouraged prior pumping/latching She'll start taking baby to breast on feeding cues, preferably around feeding times  No other support person at this time. All questions and concerns answered, family to contact [facility] services PRN.  Consult Status: NICU follow-up NICU Follow-up type: New admission follow up   Kathleen Zamora S Kathleen Zamora 05/10/2023, 4:00 PM

## 2023-05-10 NOTE — Anesthesia Preprocedure Evaluation (Signed)
Anesthesia Evaluation  Patient identified by MRN, date of birth, ID band Patient awake    Reviewed: Allergy & Precautions, Patient's Chart, lab work & pertinent test results  Airway Mallampati: II  TM Distance: >3 FB Neck ROM: Full    Dental no notable dental hx.    Pulmonary neg pulmonary ROS   Pulmonary exam normal breath sounds clear to auscultation       Cardiovascular negative cardio ROS Normal cardiovascular exam Rhythm:Regular Rate:Normal     Neuro/Psych negative neurological ROS  negative psych ROS   GI/Hepatic negative GI ROS, Neg liver ROS,,,  Endo/Other  negative endocrine ROS    Renal/GU negative Renal ROS  negative genitourinary   Musculoskeletal negative musculoskeletal ROS (+)    Abdominal   Peds negative pediatric ROS (+)  Hematology negative hematology ROS (+) Hb 12.7, plt 262   Anesthesia Other Findings   Reproductive/Obstetrics (+) Pregnancy                             Anesthesia Physical Anesthesia Plan  ASA: 2  Anesthesia Plan: Epidural   Post-op Pain Management:    Induction:   PONV Risk Score and Plan: 2  Airway Management Planned: Natural Airway  Additional Equipment: None  Intra-op Plan:   Post-operative Plan:   Informed Consent: I have reviewed the patients History and Physical, chart, labs and discussed the procedure including the risks, benefits and alternatives for the proposed anesthesia with the patient or authorized representative who has indicated his/her understanding and acceptance.       Plan Discussed with:   Anesthesia Plan Comments:        Anesthesia Quick Evaluation

## 2023-05-10 NOTE — Anesthesia Procedure Notes (Signed)

## 2023-05-10 NOTE — Progress Notes (Signed)
Epidural in-comfortable FHT cat one UC q2-4 min Cx 4/90/-2 per nurse check Pitocin infusing

## 2023-05-10 NOTE — Progress Notes (Signed)
Delivery Note At 5:34 AM a viable female was delivered via Vaginal, Spontaneous (Presentation: Left Occiput Anterior).  APGAR: 8, 8; weight  .   Placenta status: Spontaneous, Intact.  Cord: 3 vessels with the following complications: None.  Cord pH:   Prompt bulb suction after delivery. Baby initially had a good cry. Then peds called for grunting>currently evaluating baby.   Anesthesia: Epidural Episiotomy: second degree MLE over 6:00 laceration>repaired Lacerations:   Suture Repair: 2.0 vicryl rapide Est. Blood Loss (mL):  150  Mom to postpartum.  Baby to Couplet care / Skin to Skin.  Kathleen Zamora 05/10/2023, 5:53 AM

## 2023-05-10 NOTE — Progress Notes (Signed)
Low risk

## 2023-05-10 NOTE — Anesthesia Postprocedure Evaluation (Signed)
Anesthesia Post Note  Patient: Kathleen Zamora  Procedure(s) Performed: AN AD HOC LABOR EPIDURAL     Patient location during evaluation: Mother Baby Anesthesia Type: Epidural Level of consciousness: awake and alert and oriented Pain management: satisfactory to patient Vital Signs Assessment: post-procedure vital signs reviewed and stable Respiratory status: respiratory function stable and spontaneous breathing Cardiovascular status: blood pressure returned to baseline Postop Assessment: no headache, no backache, spinal receding, patient able to bend at knees and adequate PO intake Anesthetic complications: no   No notable events documented.  Last Vitals:  Vitals:   05/10/23 1350 05/10/23 1800  BP: 109/82 106/83  Pulse: 76 73  Resp: 16 16  Temp: 37.1 C 37 C  SpO2: 100% 99%    Last Pain:  Vitals:   05/10/23 1800  TempSrc: Oral  PainSc: 4    Pain Goal:                   Molli Hazard

## 2023-05-11 LAB — CBC
HCT: 30.9 % — ABNORMAL LOW (ref 36.0–46.0)
Hemoglobin: 10.4 g/dL — ABNORMAL LOW (ref 12.0–15.0)
MCH: 31 pg (ref 26.0–34.0)
MCHC: 33.7 g/dL (ref 30.0–36.0)
MCV: 92.2 fL (ref 80.0–100.0)
Platelets: 212 10*3/uL (ref 150–400)
RBC: 3.35 MIL/uL — ABNORMAL LOW (ref 3.87–5.11)
RDW: 13.2 % (ref 11.5–15.5)
WBC: 12.6 10*3/uL — ABNORMAL HIGH (ref 4.0–10.5)
nRBC: 0 % (ref 0.0–0.2)

## 2023-05-11 NOTE — Progress Notes (Signed)
Postpartum Progress Note  Post Partum Day 1 s/p spontaneous vaginal delivery.  Patient reports well-controlled pain, ambulating without difficulty, voiding spontaneously, tolerating PO.  Vaginal bleeding is appropriate.   Objective: Blood pressure 118/85, pulse 78, temperature 98.2 F (36.8 C), temperature source Oral, resp. rate 17, height 5\' 5"  (1.651 m), weight 93.6 kg, SpO2 100 %, unknown if currently breastfeeding.  Physical Exam:  General: alert and no distress Lochia: appropriate Uterine Fundus: firm DVT Evaluation: No evidence of DVT seen on physical exam.  Recent Labs    05/09/23 1300  HGB 12.7  HCT 36.6    Assessment/Plan: Postpartum Day 1, s/p vaginal delivery. Continue routine postpartum care Lactation following Baby boy - desires circ. Will perform if cleared by NICU.    LOS: 2 days   Lyn Henri 05/11/2023, 6:45 AM

## 2023-05-11 NOTE — Progress Notes (Signed)
Patient transferred from couplet care to [facility]. Patient is stable, Vital signs WDL.

## 2023-05-11 NOTE — Plan of Care (Signed)

## 2023-05-12 MED ORDER — IBUPROFEN 600 MG PO TABS: 600.0000 mg | ORAL_TABLET | Freq: Four times a day (QID) | ORAL | 0 refills | Status: AC

## 2023-05-12 MED ORDER — ACETAMINOPHEN 325 MG PO TABS: 650.0000 mg | ORAL_TABLET | ORAL | 0 refills | Status: AC | PRN

## 2023-05-12 NOTE — Progress Notes (Signed)
Postpartum Progress Note  Post Partum Day 2 s/p spontaneous vaginal delivery.  Patient reports well-controlled pain, ambulating without difficulty, voiding spontaneously, tolerating PO.  Vaginal bleeding is appropriate.   Objective: Blood pressure 116/87, pulse 63, temperature 98.4 F (36.9 C), resp. rate 17, height 5\' 5"  (1.651 m), weight 93.6 kg, SpO2 100 %, unknown if currently breastfeeding.  Physical Exam:  General: alert and no distress Lochia: appropriate Uterine Fundus: firm DVT Evaluation: No evidence of DVT seen on physical exam.  Recent Labs    05/09/23 1300 05/11/23 0639  HGB 12.7 10.4*  HCT 36.6 30.9*    Assessment/Plan: Postpartum Day 2, s/p vaginal delivery. Continue routine postpartum care Baby boy - s/p circ, transferred to [facility] Lactation following Anticipate discharge home today, though baby may stay additional day   LOS: 3 days   Kathleen Zamora 05/12/2023, 7:37 AM

## 2023-05-12 NOTE — Lactation Note (Signed)
This note was copied from a baby's chart. Lactation Consultation Note  Patient Name: Kathleen Zamora [id number] Date: 05/12/2023 Age:27 hours Reason for consult: Follow-up assessment;Maternal endocrine disorder;Primapara;1st time breastfeeding;Term;Maternal discharge  Visited with family of 72 hours old FT NICU female; [name] is a P1 and reports she's been pumping and also putting baby to breast prior offering a bottle with EBM/donor milk. Her milk hasn't come in yet; stressed the importance of consistent pumping after feedings/attempts at the breast for the onset of lactogenesis II and the prevention of engorgement. [name] trying to put baby to breast when entered the room, This LC assisted with latching to the R side in football hold, baby kept slipping off the breast, but was able to stay latched once using a NS # 20 (see LATCH score). Couplet is going home today. Reviewed discharge education, engorgement prevention/treatment, red flags on when to call baby's pediatrician, lactogenesis II and anticipatory guidelines. Parents politely declined an LC OP referral but they are aware they can call and schedule an appt whenever is needed. All questions and concerns answered, family to contact [facility] services PRN.  Feeding Mother's Current Feeding Choice: Breast Milk and Donor Milk  LATCH Score Latch: Repeated attempts needed to sustain latch, nipple held in mouth throughout feeding, stimulation needed to elicit sucking reflex. (with NS # 20, baby would only do a few sucks on and off prior falling asleep at the breast)  Audible Swallowing: None (no colostrum noted on NS # 20)  Type of Nipple: Everted at rest and after stimulation  Comfort (Breast/Nipple): Soft / non-tender  Hold (Positioning): Assistance needed to correctly position infant at breast and maintain latch.  LATCH Score: 6  Lactation Tools Discussed/Used Tools: Nipple Shields Nipple shield size: 20  Interventions Interventions:  Breast feeding basics reviewed;Assisted with latch;Skin to skin;Breast compression;Adjust position;Support pillows;DEBP;Education  Discharge Discharge Education: Engorgement and breast care;Warning signs for feeding baby;Outpatient recommendation  Consult Status Consult Status: Complete Date: 05/12/23 Follow-up type: Call as needed   [name] Venetia Constable 05/12/2023, 10:56 AM

## 2023-05-13 NOTE — Discharge Summary (Signed)
Obstetric Discharge Summary  Kathleen Zamora is a 27 y.o. female that presented on 05/09/2023 for induction of labor. Her labor course was uncomplicated and she delivered a viable female infant on 05/10/2023.  Her postpartum course was uncomplicated and on PPD#2, she reported well controlled pain, spontaneous voiding, ambulating without difficulty, and tolerating PO.  She was stable for discharge home on 05/12/2023 with plans for in-office follow up.  Hemoglobin  Date Value Ref Range Status  05/11/2023 10.4 (L) 12.0 - 15.0 g/dL Final   HCT  Date Value Ref Range Status  05/11/2023 30.9 (L) 36.0 - 46.0 % Final    Physical Exam:  General: alert and no distress Lochia: appropriate Uterine Fundus: firm DVT Evaluation: No evidence of DVT seen on physical exam.  Discharge Diagnoses: Term Pregnancy-delivered  Discharge Information: Date: 05/13/2023 Activity: Pelvic rest, as tolerated Diet: routine Medications: Tylenol, motrin Condition: stable Instructions: Refer to practice specific booklet.  Discussed prior to discharge.  Discharge to: Home  Follow-up Information     [place] Follow up.   Why: Please follow up for a 6 week postpartum visit. Contact information: [address] [place] [address] [phone]                 Newborn Data: Live born female  Birth Weight: 7 lb 10.8 oz (3480 g) APGAR: 7, 8  Newborn Delivery   Birth date/time: 05/10/2023 05:34:22 Delivery type: Vaginal, Spontaneous      Home with mother.  Lyn Henri 05/13/2023, 7:45 AM

## 2023-05-15 DIAGNOSIS — Z00110 Health examination for newborn under 8 days old: Secondary | ICD-10-CM | POA: Diagnosis not present

## 2023-05-18 ENCOUNTER — Telehealth (HOSPITAL_COMMUNITY): Payer: Self-pay | Admitting: *Deleted

## 2023-05-18 NOTE — Telephone Encounter (Signed)
Patient voiced no questions or concerns regarding her health at this time. EPDS=0. Patient voiced no questions or concerns regarding infant at this time. RN reviewed ABCs of safe sleep. Patient verbalized understanding. Patient requested RN email information on hospital's postpartum classes and support groups. Email sent. Deforest Hoyles, RN, 05/18/23, [phone]

## 2023-05-20 ENCOUNTER — Other Ambulatory Visit: Payer: Self-pay | Admitting: Nurse Practitioner

## 2023-05-20 ENCOUNTER — Inpatient Hospital Stay (HOSPITAL_COMMUNITY): Payer: BC Managed Care – PPO

## 2023-05-20 DIAGNOSIS — E041 Nontoxic single thyroid nodule: Secondary | ICD-10-CM

## 2023-06-11 ENCOUNTER — Ambulatory Visit
Admission: RE | Admit: 2023-06-11 | Discharge: 2023-06-11 | Disposition: A | Discharge status: Home / Self Care | Payer: BC Managed Care – PPO | Source: Ambulatory Visit | Attending: Nurse Practitioner | Admitting: Nurse Practitioner

## 2023-06-11 DIAGNOSIS — E041 Nontoxic single thyroid nodule: Secondary | ICD-10-CM | POA: Diagnosis not present

## 2023-06-27 DIAGNOSIS — Z1389 Encounter for screening for other disorder: Secondary | ICD-10-CM | POA: Diagnosis not present

## 2024-07-23 DIAGNOSIS — Z3685 Encounter for antenatal screening for Streptococcus B: Secondary | ICD-10-CM | POA: Diagnosis not present

## 2024-07-23 DIAGNOSIS — Z3A08 8 weeks gestation of pregnancy: Secondary | ICD-10-CM | POA: Diagnosis not present

## 2024-07-23 DIAGNOSIS — Z3481 Encounter for supervision of other normal pregnancy, first trimester: Secondary | ICD-10-CM | POA: Diagnosis not present

## 2024-07-23 DIAGNOSIS — Z3400 Encounter for supervision of normal first pregnancy, unspecified trimester: Secondary | ICD-10-CM | POA: Diagnosis not present

## 2024-07-29 DIAGNOSIS — Z3A09 9 weeks gestation of pregnancy: Secondary | ICD-10-CM | POA: Diagnosis not present

## 2024-07-29 DIAGNOSIS — Z3481 Encounter for supervision of other normal pregnancy, first trimester: Secondary | ICD-10-CM | POA: Diagnosis not present

## 2024-07-29 DIAGNOSIS — Z113 Encounter for screening for infections with a predominantly sexual mode of transmission: Secondary | ICD-10-CM | POA: Diagnosis not present

## 2024-09-02 DIAGNOSIS — M461 Sacroiliitis, not elsewhere classified: Secondary | ICD-10-CM | POA: Diagnosis not present

## 2024-09-02 DIAGNOSIS — M5416 Radiculopathy, lumbar region: Secondary | ICD-10-CM | POA: Diagnosis not present

## 2024-09-07 DIAGNOSIS — M5416 Radiculopathy, lumbar region: Secondary | ICD-10-CM | POA: Diagnosis not present

## 2024-09-07 DIAGNOSIS — M461 Sacroiliitis, not elsewhere classified: Secondary | ICD-10-CM | POA: Diagnosis not present

## 2024-09-10 DIAGNOSIS — M461 Sacroiliitis, not elsewhere classified: Secondary | ICD-10-CM | POA: Diagnosis not present

## 2024-09-10 DIAGNOSIS — M5416 Radiculopathy, lumbar region: Secondary | ICD-10-CM | POA: Diagnosis not present

## 2024-09-14 DIAGNOSIS — M461 Sacroiliitis, not elsewhere classified: Secondary | ICD-10-CM | POA: Diagnosis not present

## 2024-09-14 DIAGNOSIS — M5416 Radiculopathy, lumbar region: Secondary | ICD-10-CM | POA: Diagnosis not present

## 2024-09-16 DIAGNOSIS — M461 Sacroiliitis, not elsewhere classified: Secondary | ICD-10-CM | POA: Diagnosis not present

## 2024-09-16 DIAGNOSIS — M5416 Radiculopathy, lumbar region: Secondary | ICD-10-CM | POA: Diagnosis not present

## 2024-09-21 DIAGNOSIS — M461 Sacroiliitis, not elsewhere classified: Secondary | ICD-10-CM | POA: Diagnosis not present

## 2024-09-24 DIAGNOSIS — M461 Sacroiliitis, not elsewhere classified: Secondary | ICD-10-CM | POA: Diagnosis not present

## 2024-09-24 DIAGNOSIS — M5416 Radiculopathy, lumbar region: Secondary | ICD-10-CM | POA: Diagnosis not present

## 2024-09-28 DIAGNOSIS — M5416 Radiculopathy, lumbar region: Secondary | ICD-10-CM | POA: Diagnosis not present

## 2024-09-28 DIAGNOSIS — M461 Sacroiliitis, not elsewhere classified: Secondary | ICD-10-CM | POA: Diagnosis not present

## 2024-10-01 DIAGNOSIS — M461 Sacroiliitis, not elsewhere classified: Secondary | ICD-10-CM | POA: Diagnosis not present

## 2024-10-01 DIAGNOSIS — M5416 Radiculopathy, lumbar region: Secondary | ICD-10-CM | POA: Diagnosis not present

## 2024-10-05 DIAGNOSIS — M461 Sacroiliitis, not elsewhere classified: Secondary | ICD-10-CM | POA: Diagnosis not present

## 2024-10-05 DIAGNOSIS — M5416 Radiculopathy, lumbar region: Secondary | ICD-10-CM | POA: Diagnosis not present

## 2024-10-07 DIAGNOSIS — Z363 Encounter for antenatal screening for malformations: Secondary | ICD-10-CM | POA: Diagnosis not present

## 2024-10-07 DIAGNOSIS — M5416 Radiculopathy, lumbar region: Secondary | ICD-10-CM | POA: Diagnosis not present

## 2024-10-07 DIAGNOSIS — Z3400 Encounter for supervision of normal first pregnancy, unspecified trimester: Secondary | ICD-10-CM | POA: Diagnosis not present

## 2024-10-07 DIAGNOSIS — E039 Hypothyroidism, unspecified: Secondary | ICD-10-CM | POA: Diagnosis not present

## 2024-10-07 DIAGNOSIS — M461 Sacroiliitis, not elsewhere classified: Secondary | ICD-10-CM | POA: Diagnosis not present

## 2024-10-07 DIAGNOSIS — Z3A19 19 weeks gestation of pregnancy: Secondary | ICD-10-CM | POA: Diagnosis not present

## 2024-10-12 DIAGNOSIS — M5416 Radiculopathy, lumbar region: Secondary | ICD-10-CM | POA: Diagnosis not present

## 2024-10-12 DIAGNOSIS — M461 Sacroiliitis, not elsewhere classified: Secondary | ICD-10-CM | POA: Diagnosis not present

## 2024-10-15 DIAGNOSIS — M5416 Radiculopathy, lumbar region: Secondary | ICD-10-CM | POA: Diagnosis not present

## 2024-10-15 DIAGNOSIS — M461 Sacroiliitis, not elsewhere classified: Secondary | ICD-10-CM | POA: Diagnosis not present

## 2024-10-21 DIAGNOSIS — M461 Sacroiliitis, not elsewhere classified: Secondary | ICD-10-CM | POA: Diagnosis not present

## 2024-10-21 DIAGNOSIS — M5416 Radiculopathy, lumbar region: Secondary | ICD-10-CM | POA: Diagnosis not present

## 2024-10-26 DIAGNOSIS — M461 Sacroiliitis, not elsewhere classified: Secondary | ICD-10-CM | POA: Diagnosis not present

## 2024-10-26 DIAGNOSIS — M5416 Radiculopathy, lumbar region: Secondary | ICD-10-CM | POA: Diagnosis not present

## 2024-10-29 DIAGNOSIS — M5416 Radiculopathy, lumbar region: Secondary | ICD-10-CM | POA: Diagnosis not present

## 2024-10-29 DIAGNOSIS — M461 Sacroiliitis, not elsewhere classified: Secondary | ICD-10-CM | POA: Diagnosis not present

## 2024-11-09 DIAGNOSIS — M461 Sacroiliitis, not elsewhere classified: Secondary | ICD-10-CM | POA: Diagnosis not present

## 2024-11-09 DIAGNOSIS — M5416 Radiculopathy, lumbar region: Secondary | ICD-10-CM | POA: Diagnosis not present

## 2024-11-10 DIAGNOSIS — Z362 Encounter for other antenatal screening follow-up: Secondary | ICD-10-CM | POA: Diagnosis not present

## 2024-11-10 DIAGNOSIS — Z3482 Encounter for supervision of other normal pregnancy, second trimester: Secondary | ICD-10-CM | POA: Diagnosis not present

## 2024-11-10 DIAGNOSIS — Z3A24 24 weeks gestation of pregnancy: Secondary | ICD-10-CM | POA: Diagnosis not present

## 2024-11-18 DIAGNOSIS — M5416 Radiculopathy, lumbar region: Secondary | ICD-10-CM | POA: Diagnosis not present

## 2024-11-18 DIAGNOSIS — M461 Sacroiliitis, not elsewhere classified: Secondary | ICD-10-CM | POA: Diagnosis not present

## 2024-11-24 DIAGNOSIS — M461 Sacroiliitis, not elsewhere classified: Secondary | ICD-10-CM | POA: Diagnosis not present

## 2024-11-24 DIAGNOSIS — M5416 Radiculopathy, lumbar region: Secondary | ICD-10-CM | POA: Diagnosis not present

## 2024-12-04 DIAGNOSIS — M5416 Radiculopathy, lumbar region: Secondary | ICD-10-CM | POA: Diagnosis not present

## 2024-12-04 DIAGNOSIS — M461 Sacroiliitis, not elsewhere classified: Secondary | ICD-10-CM | POA: Diagnosis not present

## 2025-03-01 ENCOUNTER — Inpatient Hospital Stay (HOSPITAL_COMMUNITY): Admit: 2025-03-01 | Payer: Self-pay
